# Patient Record
Sex: Female | Born: 1950 | Race: Black or African American | Hispanic: No | Marital: Single | Smoking: Former smoker
Health system: Southern US, Community
[De-identification: ages and names within clinical notes are randomized; demographics above are authoritative.]

## PROBLEM LIST (undated history)

## (undated) DIAGNOSIS — Z9889 Other specified postprocedural states: Secondary | ICD-10-CM

## (undated) DIAGNOSIS — D649 Anemia, unspecified: Secondary | ICD-10-CM

## (undated) DIAGNOSIS — T8859XA Other complications of anesthesia, initial encounter: Secondary | ICD-10-CM

## (undated) DIAGNOSIS — T4145XA Adverse effect of unspecified anesthetic, initial encounter: Secondary | ICD-10-CM

## (undated) DIAGNOSIS — R112 Nausea with vomiting, unspecified: Secondary | ICD-10-CM

## (undated) HISTORY — PX: OTHER SURGICAL HISTORY: SHX169

## (undated) HISTORY — PX: ABDOMINAL HYSTERECTOMY: SHX81

## (undated) HISTORY — PX: APPENDECTOMY: SHX54

---

## 2016-01-18 ENCOUNTER — Emergency Department (HOSPITAL_BASED_OUTPATIENT_CLINIC_OR_DEPARTMENT_OTHER): Payer: BLUE CROSS/BLUE SHIELD

## 2016-01-18 ENCOUNTER — Encounter (HOSPITAL_BASED_OUTPATIENT_CLINIC_OR_DEPARTMENT_OTHER): Payer: Self-pay | Admitting: *Deleted

## 2016-01-18 ENCOUNTER — Emergency Department (HOSPITAL_BASED_OUTPATIENT_CLINIC_OR_DEPARTMENT_OTHER)
Admission: EM | Admit: 2016-01-18 | Discharge: 2016-01-18 | Disposition: A | Discharge status: Home / Self Care | Payer: BLUE CROSS/BLUE SHIELD | Attending: Emergency Medicine | Admitting: Emergency Medicine

## 2016-01-18 DIAGNOSIS — Y990 Civilian activity done for income or pay: Secondary | ICD-10-CM | POA: Diagnosis not present

## 2016-01-18 DIAGNOSIS — W108XXA Fall (on) (from) other stairs and steps, initial encounter: Secondary | ICD-10-CM | POA: Insufficient documentation

## 2016-01-18 DIAGNOSIS — S82832A Other fracture of upper and lower end of left fibula, initial encounter for closed fracture: Secondary | ICD-10-CM | POA: Diagnosis not present

## 2016-01-18 DIAGNOSIS — Y9389 Activity, other specified: Secondary | ICD-10-CM | POA: Diagnosis not present

## 2016-01-18 DIAGNOSIS — Z87891 Personal history of nicotine dependence: Secondary | ICD-10-CM | POA: Diagnosis not present

## 2016-01-18 DIAGNOSIS — S99912A Unspecified injury of left ankle, initial encounter: Secondary | ICD-10-CM | POA: Diagnosis present

## 2016-01-18 DIAGNOSIS — Y929 Unspecified place or not applicable: Secondary | ICD-10-CM | POA: Diagnosis not present

## 2016-01-18 DIAGNOSIS — S82899A Other fracture of unspecified lower leg, initial encounter for closed fracture: Secondary | ICD-10-CM

## 2016-01-18 MED ORDER — OXYCODONE-ACETAMINOPHEN 5-325 MG PO TABS
1.0000 | ORAL_TABLET | ORAL | Status: DC | PRN
  Administered 2016-01-18: 1 via ORAL
  Filled 2016-01-18: qty 1

## 2016-01-18 MED ORDER — ETOMIDATE 2 MG/ML IV SOLN
INTRAVENOUS | Status: AC | PRN
  Administered 2016-01-18: 9.07 mg via INTRAVENOUS

## 2016-01-18 MED ORDER — ETOMIDATE 2 MG/ML IV SOLN
0.1000 mg/kg | Freq: Once | INTRAVENOUS | Status: DC
  Filled 2016-01-18: qty 10

## 2016-01-18 MED ORDER — SODIUM CHLORIDE 0.9 % IV BOLUS (SEPSIS)
500.0000 mL | Freq: Once | INTRAVENOUS | Status: AC
  Administered 2016-01-18: 500 mL via INTRAVENOUS

## 2016-01-18 MED ORDER — OXYCODONE-ACETAMINOPHEN 5-325 MG PO TABS: 1.0000 | ORAL_TABLET | ORAL | 0 refills | Status: AC | PRN

## 2016-01-18 NOTE — Discharge Instructions (Signed)
You have been seen in the ED today with an ankle fracture. We reduced the fracture and placed a splint that should stay clean and dry. Do not put weight on the ankle at all. When seated, keep the foot elevated. Call the orthopedic surgeon for an appointment in 1 week.   Return to the ED with any new or worsening pain in the ankle, fever, or chills.

## 2016-01-18 NOTE — ED Notes (Signed)
Family at bedside. 

## 2016-01-18 NOTE — ED Notes (Signed)
ETCO2 and O2 d/c'd RR 18, SpO2 100% on r/a

## 2016-01-18 NOTE — Sedation Documentation (Signed)
Unable to assess pain due to sedation.  

## 2016-01-18 NOTE — ED Provider Notes (Signed)
Emergency Department Provider Note   I have reviewed the triage vital signs and the nursing notes.   HISTORY  Chief Complaint Fall   HPI Molly Henderson is a 66 y.o. female presents to the emergency department for evaluation of left ankle pain after mechanical fall. The patient states she was coming down steps and slipped on some wet concrete. She landed on her butt and began experiencing severe left ankle pain and swelling. She was unable to ambulate but was able to call for help and presented to the emergency department. She denies any color change of the foot. No numbness or tingling in the extremity. She has no pain in her hip or knee. She denies head trauma or loss of consciousness. She is experiencing some mild right-sided lateral neck discomfort. Pain is worse with movement. She was given a Percocet prior to my evaluation states that is improved her pain symptoms.  No past medical history on file.  There are no active problems to display for this patient.   No past surgical history on file.    Allergies Penicillins  No family history on file.  Social History Social History  Substance Use Topics  . Smoking status: Never Smoker  . Smokeless tobacco: Never Used  . Alcohol use Not on file    Review of Systems  Constitutional: No fever/chills Eyes: No visual changes. ENT: No sore throat. Cardiovascular: Denies chest pain. Respiratory: Denies shortness of breath. Gastrointestinal: No abdominal pain.  No nausea, no vomiting.  No diarrhea.  No constipation. Genitourinary: Negative for dysuria. Musculoskeletal: Negative for back pain. Left ankle pain and swelling.  Skin: Negative for rash. Neurological: Negative for headaches, focal weakness or numbness.  10-point ROS otherwise negative.  ____________________________________________   PHYSICAL EXAM:  VITAL SIGNS: ED Triage Vitals  Enc Vitals Group     BP 01/18/16 1305 139/70     Pulse Rate 01/18/16 1305  78     Resp 01/18/16 1305 18     Temp 01/18/16 1305 97.8 F (36.6 C)     Temp Source 01/18/16 1305 Oral     SpO2 01/18/16 1305 100 %     Weight 01/18/16 1307 200 lb (90.7 kg)     Height 01/18/16 1307 5\' 5"  (1.651 m)     Pain Score 01/18/16 1359 9   Constitutional: Alert and oriented. Well appearing and in no acute distress. Eyes: Conjunctivae are normal.  Head: Atraumatic. Nose: No congestion/rhinnorhea. Mouth/Throat: Mucous membranes are moist.  Neck: No stridor.  Cardiovascular: Normal rate, regular rhythm. Good peripheral circulation. Grossly normal heart sounds.   Respiratory: Normal respiratory effort.  No retractions. Lungs CTAB. Gastrointestinal: Soft and nontender. No distention.  Musculoskeletal: Left ankle swelling and deformity noted. Circular abrasion to the medial malleolus. No other area of laceration or skin breakdown. The foot is neurovascularly intact.  Neurologic:  Normal speech and language. No gross focal neurologic deficits are appreciated.  Skin:  Skin is warm, dry and intact. Medial abrasion noted as above.  ____________________________________________  RADIOLOGY  Dg Ankle Complete Left  Result Date: 01/18/2016 CLINICAL DATA:  Fall with pain and injury to the left ankle EXAM: LEFT ANKLE COMPLETE - 3+ VIEW COMPARISON:  None. FINDINGS: There is a oblique fracture through the distal shaft of the fibula. 1/4 shaft diameter of lateral and posterior displacement of distal fracture fragment. Associated soft tissue swelling. Asymmetric widening of the medial mortise. Additional avulsion fracture fragments along the inferior medial joint space, uncertain if this is from  the medial malleolus or the medial talus. IMPRESSION: 1. Mildly displaced fracture through the distal fibula 2. Widened medial mortise consistent with ligamentous injury 3. Avulsion fracture injury along the medial, inferior joint margin Electronically Signed   By: Jasmine Pang M.D.   On: 01/18/2016 13:56     Dg Ankle Left Port  Result Date: 01/18/2016 CLINICAL DATA:  Post reduction film. EXAM: PORTABLE LEFT ANKLE - 2 VIEW COMPARISON:  Plain films earlier today. FINDINGS: Posterior splint across a distal fibular fracture, with less widening of the ankle mortise. Improved alignment of the distal tibia on the talus. Posterior malleolar fracture is suspected also. No definite talar fracture. IMPRESSION: Improved alignment post closed reduction. Electronically Signed   By: Elsie Stain M.D.   On: 01/18/2016 17:42    ____________________________________________   PROCEDURES  Procedure(s) performed:   .Sedation Date/Time: 01/18/2016 6:21 PM Performed by: LONG, JOSHUA G Authorized by: Maia Plan   Consent:    Consent obtained:  Written   Consent given by:  Patient   Risks discussed:  Prolonged hypoxia resulting in organ damage, prolonged sedation necessitating reversal, dysrhythmia, allergic reaction, inadequate sedation, respiratory compromise necessitating ventilatory assistance and intubation, nausea and vomiting   Alternatives discussed:  Anxiolysis Indications:    Procedure performed:  Fracture reduction   Procedure necessitating sedation performed by:  Physician performing sedation   Intended level of sedation:  Moderate (conscious sedation) Pre-sedation assessment:    Neck mobility: normal     Mallampati score:  I - soft palate, uvula, fauces, pillars visible   Pre-sedation assessments completed and reviewed: airway patency, cardiovascular function, hydration status, mental status, nausea/vomiting, pain level, respiratory function and temperature     History of difficult intubation: yes   Immediate pre-procedure details:    Reassessment: Patient reassessed immediately prior to procedure     Reviewed: vital signs, relevant labs/tests and NPO status     Verified: bag valve mask available, emergency equipment available, intubation equipment available, IV patency confirmed, oxygen  available and suction available   Procedure details (see MAR for exact dosages):    Sedation start time:  01/18/2016 4:57 PM   Preoxygenation:  Nasal cannula   Sedation:  Etomidate   Intra-procedure monitoring:  Blood pressure monitoring, cardiac monitor, continuous capnometry, frequent vital sign checks, frequent LOC assessments and continuous pulse oximetry   Intra-procedure events: none     Intra-procedure management:  Airway repositioning   Sedation end time:  01/18/2016 5:10 PM Post-procedure details:    Attendance: Constant attendance by certified staff until patient recovered     Recovery: Patient returned to pre-procedure baseline     Complications:  None   Post-sedation assessments completed and reviewed: airway patency, cardiovascular function, hydration status, mental status, nausea/vomiting, pain level, respiratory function and temperature     Patient is stable for discharge or admission: yes     Patient tolerance:  Tolerated well, no immediate complications     Fracture Reduction and Splint Application Procedure Note:  Discussed the procedure with the patient in detail. I reviewed the x-ray images prior to procedure. Conscious sedation was applied as outlined above. In-line traction was applied to the ankle and the joint was shifted medially. The patient has intact pulses and sensation after procedure. She is noted to have an abrasion to the medial ankle prior to the procedure and splint application. Bacitracin and a protective gauze was applied. Copious gauze padding along applied. A posterior mold splint with stirrups was applied for continued stabilization. Post  reduction films were reviewed and the joint mortise alignment was improved ____________________________________________   INITIAL IMPRESSION / ASSESSMENT AND PLAN / ED COURSE  Pertinent labs & imaging results that were available during my care of the patient were reviewed by me and considered in my medical decision  making (see chart for details).  Patient presents to the emergency department for evaluation of left ankle pain and swelling. She does have a small abrasion to the medial aspect of her left ankle. Does not appear to track deeper. There is an avulsion fracture on the medial side of the ankle. There is a distal, displaced fibular fracture with no abrasion or laceration on the outside of the ankle. Patient has intact pulses. Plan for orthopedic consult, splinting, crutches, and Orthopedics follow up.   05:45 PM Spoke with Dr. Linna Caprice who reviewed images. Reduction looks good. Provided crutches and return precautions. Will see ortho this week for re-evaluation.   At this time, I do not feel there is any life-threatening condition present. I have reviewed and discussed all results (EKG, imaging, lab, urine as appropriate), exam findings with patient. I have reviewed nursing notes and appropriate previous records.  I feel the patient is safe to be discharged home without further emergent workup. Discussed usual and customary return precautions. Patient and family (if present) verbalize understanding and are comfortable with this plan.  Patient will follow-up with their primary care provider. If they do not have a primary care provider, information for follow-up has been provided to them. All questions have been answered.  ____________________________________________  FINAL CLINICAL IMPRESSION(S) / ED DIAGNOSES  Final diagnoses:  Ankle fracture     MEDICATIONS GIVEN DURING THIS VISIT:  Medications  sodium chloride 0.9 % bolus 500 mL (0 mLs Intravenous Stopped 01/18/16 1807)  etomidate (AMIDATE) injection (9.07 mg Intravenous Given 01/18/16 1657)     NEW OUTPATIENT MEDICATIONS STARTED DURING THIS VISIT:  Discharge Medication List as of 01/18/2016  5:46 PM    START taking these medications   Details  oxyCODONE-acetaminophen (PERCOCET/ROXICET) 5-325 MG tablet Take 1 tablet by mouth every 4  (four) hours as needed for severe pain., Starting Fri 01/18/2016, Print        Note:  This document was prepared using Dragon voice recognition software and may include unintentional dictation errors.  Alona Bene, MD Emergency Medicine   Maia Plan, MD 01/19/16 [phone]

## 2016-01-18 NOTE — ED Triage Notes (Signed)
She was on her way to work this am and slipped on wet step. Fell down one step. Injury to her left ankle.

## 2016-01-18 NOTE — ED Notes (Signed)
Handoff report given to rec RN

## 2016-01-18 NOTE — ED Notes (Signed)
Pt states fell today, onto concrete, denies hitting head. States the ground was wet and fell due to a slippery pavement. Pain immediately to left leg. Pain all over at LLE.

## 2016-01-18 NOTE — ED Notes (Signed)
Able to move toes and left foot. Weak dorsal flexion noted, states has some numbness of left foot at this time

## 2016-01-18 NOTE — Sedation Documentation (Signed)
Patient denies pain and is resting comfortably.  

## 2016-01-18 NOTE — ED Notes (Signed)
Ice pack and elevation continued upon arrival to room

## 2016-01-18 NOTE — ED Notes (Signed)
Pt states elevation and ice helps with pain control

## 2016-01-18 NOTE — ED Notes (Signed)
Pt has 1+ left pedal pulse w/ 2+ left post tib pulse, cap refill less than 3 sec

## 2016-01-18 NOTE — ED Notes (Signed)
Patient to restroom while on the stedy.

## 2016-01-22 ENCOUNTER — Encounter
Admission: RE | Admit: 2016-01-22 | Discharge: 2016-01-22 | Disposition: A | Discharge status: Home / Self Care | Payer: BLUE CROSS/BLUE SHIELD | Source: Ambulatory Visit | Attending: Podiatry | Admitting: Podiatry

## 2016-01-22 HISTORY — DX: Anemia, unspecified: D64.9

## 2016-01-22 HISTORY — DX: Adverse effect of unspecified anesthetic, initial encounter: T41.45XA

## 2016-01-22 HISTORY — DX: Other complications of anesthesia, initial encounter: T88.59XA

## 2016-01-22 HISTORY — DX: Nausea with vomiting, unspecified: R11.2

## 2016-01-22 HISTORY — DX: Other specified postprocedural states: Z98.890

## 2016-01-22 NOTE — Patient Instructions (Signed)
  Your procedure is scheduled on: 01-25-16 Report to Same Day Surgery 2nd floor medical mall [facility](Medical Mall Entrance-take elevator on left to 2nd floor.  Check in with surgery information desk.) To find out your arrival time please call [phone] between 1PM - 3PM on 01-24-16  Remember: Instructions that are not followed completely may result in serious medical risk, up to and including death, or upon the discretion of your surgeon and anesthesiologist your surgery may need to be rescheduled.    _x___ 1. Do not eat food or drink liquids after midnight. No gum chewing or hard candies.     __x__ 2. No Alcohol for 24 hours before or after surgery.   __x__3. No Smoking for 24 prior to surgery.   ____  4. Bring all medications with you on the day of surgery if instructed.    __x__ 5. Notify your doctor if there is any change in your medical condition     (cold, fever, infections).     Do not wear jewelry, make-up, hairpins, clips or nail polish.  Do not wear lotions, powders, or perfumes. You may wear deodorant.  Do not shave 48 hours prior to surgery. Men may shave face and neck.  Do not bring valuables to the hospital.    [facility] is not responsible for any belongings or valuables.               Contacts, dentures or bridgework may not be worn into surgery.  Leave your suitcase in the car. After surgery it may be brought to your room.  For patients admitted to the hospital, discharge time is determined by your treatment team.   Patients discharged the day of surgery will not be allowed to drive home.  You will need someone to drive you home and stay with you the night of your procedure.    Please read over the following fact sheets that you were given:   [facility] Preparing for Surgery and or MRSA Information   ____ Take these medicines the morning of surgery with A SIP OF WATER:    1. MAY TAKE PERCOCET IF NEEDED AM OF SURGERY  2.  3.  4.  5.  6.  ____Fleets enema or  Magnesium Citrate as directed.   ____ Use CHG Soap or sage wipes as directed on instruction sheet   ____ Use inhalers on the day of surgery and bring to hospital day of surgery  ____ Stop metformin 2 days prior to surgery    ____ Take 1/2 of usual insulin dose the night before surgery and none on the morning of  surgery.   ____ Stop Aspirin, Coumadin, Pllavix ,Eliquis, Effient, or Pradaxa  x__ Stop Anti-inflammatories such as Advil, Aleve, Ibuprofen, Motrin, Naproxen,          Naprosyn, Goodies powders or aspirin products NOW-Ok to take Tylenol OR PERCOCET IF NEEDED    _X___ Stop supplements until after surgery-PT STOPPED ALL SUPPLEMENTS DAYS AGO  ____ Bring C-Pap to the hospital.

## 2016-01-25 ENCOUNTER — Ambulatory Visit: Payer: BLUE CROSS/BLUE SHIELD | Admitting: Anesthesiology

## 2016-01-25 ENCOUNTER — Other Ambulatory Visit: Payer: Self-pay

## 2016-01-25 ENCOUNTER — Encounter
Admission: RE | Disposition: A | Discharge status: Home / Self Care | Payer: Self-pay | Source: Ambulatory Visit | Attending: Podiatry

## 2016-01-25 ENCOUNTER — Ambulatory Visit: Payer: BLUE CROSS/BLUE SHIELD

## 2016-01-25 ENCOUNTER — Encounter: Payer: Self-pay | Admitting: *Deleted

## 2016-01-25 ENCOUNTER — Ambulatory Visit
Admission: RE | Admit: 2016-01-25 | Discharge: 2016-01-25 | Disposition: A | Discharge status: Home / Self Care | Payer: BLUE CROSS/BLUE SHIELD | Source: Ambulatory Visit | Attending: Podiatry | Admitting: Podiatry

## 2016-01-25 DIAGNOSIS — S82832A Other fracture of upper and lower end of left fibula, initial encounter for closed fracture: Secondary | ICD-10-CM | POA: Diagnosis present

## 2016-01-25 DIAGNOSIS — S93422A Sprain of deltoid ligament of left ankle, initial encounter: Secondary | ICD-10-CM | POA: Insufficient documentation

## 2016-01-25 DIAGNOSIS — X501XXA Overexertion from prolonged static or awkward postures, initial encounter: Secondary | ICD-10-CM | POA: Diagnosis not present

## 2016-01-25 DIAGNOSIS — Z419 Encounter for procedure for purposes other than remedying health state, unspecified: Secondary | ICD-10-CM

## 2016-01-25 DIAGNOSIS — Z01818 Encounter for other preprocedural examination: Secondary | ICD-10-CM

## 2016-01-25 DIAGNOSIS — Z87891 Personal history of nicotine dependence: Secondary | ICD-10-CM | POA: Insufficient documentation

## 2016-01-25 HISTORY — PX: ORIF ANKLE FRACTURE: SHX5408

## 2016-01-25 SURGERY — OPEN REDUCTION INTERNAL FIXATION (ORIF) ANKLE FRACTURE
Anesthesia: General | Laterality: Left

## 2016-01-25 MED ORDER — OXYCODONE-ACETAMINOPHEN 5-325 MG PO TABS: 1.0000 | ORAL_TABLET | ORAL | Status: DC | PRN

## 2016-01-25 MED ORDER — SUGAMMADEX SODIUM 200 MG/2ML IV SOLN
INTRAVENOUS | Status: DC | PRN
  Administered 2016-01-25: 180 mg via INTRAVENOUS

## 2016-01-25 MED ORDER — PROPOFOL 10 MG/ML IV BOLUS
INTRAVENOUS | Status: DC | PRN
  Administered 2016-01-25: 200 mg via INTRAVENOUS

## 2016-01-25 MED ORDER — ACETAMINOPHEN 10 MG/ML IV SOLN
INTRAVENOUS | Status: DC | PRN
  Administered 2016-01-25: 1000 mg via INTRAVENOUS

## 2016-01-25 MED ORDER — MIDAZOLAM HCL 2 MG/2ML IJ SOLN
INTRAMUSCULAR | Status: AC
  Filled 2016-01-25: qty 2

## 2016-01-25 MED ORDER — MIDAZOLAM HCL 2 MG/2ML IJ SOLN
INTRAMUSCULAR | Status: DC | PRN
  Administered 2016-01-25: 2 mg via INTRAVENOUS

## 2016-01-25 MED ORDER — DEXAMETHASONE SODIUM PHOSPHATE 10 MG/ML IJ SOLN
INTRAMUSCULAR | Status: AC
  Filled 2016-01-25: qty 1

## 2016-01-25 MED ORDER — FENTANYL CITRATE (PF) 100 MCG/2ML IJ SOLN
INTRAMUSCULAR | Status: DC | PRN
  Administered 2016-01-25 (×4): 50 ug via INTRAVENOUS

## 2016-01-25 MED ORDER — FENTANYL CITRATE (PF) 100 MCG/2ML IJ SOLN
INTRAMUSCULAR | Status: AC
  Filled 2016-01-25: qty 2

## 2016-01-25 MED ORDER — MIDAZOLAM HCL 2 MG/2ML IJ SOLN
2.0000 mg | Freq: Once | INTRAMUSCULAR | Status: AC
  Administered 2016-01-25: 2 mg via INTRAVENOUS

## 2016-01-25 MED ORDER — ONDANSETRON HCL 4 MG/2ML IJ SOLN: 4.0000 mg | Freq: Once | INTRAMUSCULAR | Status: DC | PRN

## 2016-01-25 MED ORDER — CLINDAMYCIN PHOSPHATE 600 MG/50ML IV SOLN
600.0000 mg | Freq: Once | INTRAVENOUS | Status: AC
  Administered 2016-01-25: 600 mg via INTRAVENOUS

## 2016-01-25 MED ORDER — ONDANSETRON HCL 4 MG/2ML IJ SOLN
INTRAMUSCULAR | Status: DC | PRN
  Administered 2016-01-25: 4 mg via INTRAVENOUS

## 2016-01-25 MED ORDER — FENTANYL CITRATE (PF) 100 MCG/2ML IJ SOLN
INTRAMUSCULAR | Status: AC
  Administered 2016-01-25: 25 ug via INTRAVENOUS
  Filled 2016-01-25: qty 2

## 2016-01-25 MED ORDER — ONDANSETRON HCL 4 MG/2ML IJ SOLN
INTRAMUSCULAR | Status: AC
Start: 2016-01-25 — End: 2016-01-25
  Filled 2016-01-25: qty 2

## 2016-01-25 MED ORDER — ROCURONIUM BROMIDE 50 MG/5ML IV SOSY
PREFILLED_SYRINGE | INTRAVENOUS | Status: AC
  Filled 2016-01-25: qty 5

## 2016-01-25 MED ORDER — DEXAMETHASONE SODIUM PHOSPHATE 10 MG/ML IJ SOLN
INTRAMUSCULAR | Status: DC | PRN
  Administered 2016-01-25: 10 mg via INTRAVENOUS

## 2016-01-25 MED ORDER — FENTANYL CITRATE (PF) 100 MCG/2ML IJ SOLN
INTRAMUSCULAR | Status: AC
  Administered 2016-01-25: 50 ug via INTRAVENOUS
  Filled 2016-01-25: qty 2

## 2016-01-25 MED ORDER — PROPOFOL 10 MG/ML IV BOLUS
INTRAVENOUS | Status: AC
  Filled 2016-01-25: qty 20

## 2016-01-25 MED ORDER — FENTANYL CITRATE (PF) 100 MCG/2ML IJ SOLN
50.0000 ug | Freq: Once | INTRAMUSCULAR | Status: AC
  Administered 2016-01-25: 50 ug via INTRAVENOUS

## 2016-01-25 MED ORDER — ONDANSETRON HCL 4 MG/2ML IJ SOLN: 4.0000 mg | Freq: Four times a day (QID) | INTRAMUSCULAR | Status: DC | PRN

## 2016-01-25 MED ORDER — BUPIVACAINE HCL (PF) 0.5 % IJ SOLN
INTRAMUSCULAR | Status: AC
  Filled 2016-01-25: qty 30

## 2016-01-25 MED ORDER — FENTANYL CITRATE (PF) 100 MCG/2ML IJ SOLN
25.0000 ug | INTRAMUSCULAR | Status: DC | PRN
  Administered 2016-01-25 (×4): 25 ug via INTRAVENOUS

## 2016-01-25 MED ORDER — BUPIVACAINE HCL (PF) 0.25 % IJ SOLN
INTRAMUSCULAR | Status: DC | PRN
  Administered 2016-01-25: 20 mL

## 2016-01-25 MED ORDER — BUPIVACAINE HCL (PF) 0.25 % IJ SOLN
INTRAMUSCULAR | Status: AC
  Filled 2016-01-25: qty 30

## 2016-01-25 MED ORDER — ONDANSETRON HCL 4 MG PO TABS: 4.0000 mg | ORAL_TABLET | Freq: Four times a day (QID) | ORAL | Status: DC | PRN

## 2016-01-25 MED ORDER — ROCURONIUM BROMIDE 100 MG/10ML IV SOLN
INTRAVENOUS | Status: DC | PRN
  Administered 2016-01-25: 30 mg via INTRAVENOUS

## 2016-01-25 MED ORDER — LIDOCAINE HCL (PF) 1 % IJ SOLN
INTRAMUSCULAR | Status: AC
  Filled 2016-01-25: qty 30

## 2016-01-25 MED ORDER — LIDOCAINE HCL (CARDIAC) 20 MG/ML IV SOLN
INTRAVENOUS | Status: DC | PRN
  Administered 2016-01-25: 100 mg via INTRAVENOUS

## 2016-01-25 MED ORDER — MIDAZOLAM HCL 2 MG/2ML IJ SOLN
INTRAMUSCULAR | Status: AC
  Administered 2016-01-25: 2 mg via INTRAVENOUS
  Filled 2016-01-25: qty 2

## 2016-01-25 MED ORDER — EPHEDRINE 5 MG/ML INJ
INTRAVENOUS | Status: AC
Start: 2016-01-25 — End: 2016-01-25
  Filled 2016-01-25: qty 10

## 2016-01-25 MED ORDER — LACTATED RINGERS IV SOLN
INTRAVENOUS | Status: DC
  Administered 2016-01-25: 13:00:00 via INTRAVENOUS

## 2016-01-25 MED ORDER — FAMOTIDINE 20 MG PO TABS
20.0000 mg | ORAL_TABLET | Freq: Once | ORAL | Status: AC
  Administered 2016-01-25: 20 mg via ORAL

## 2016-01-25 MED ORDER — FAMOTIDINE 20 MG PO TABS
ORAL_TABLET | ORAL | Status: AC
  Administered 2016-01-25: 20 mg via ORAL
  Filled 2016-01-25: qty 1

## 2016-01-25 MED ORDER — SUGAMMADEX SODIUM 200 MG/2ML IV SOLN
INTRAVENOUS | Status: AC
  Filled 2016-01-25: qty 2

## 2016-01-25 MED ORDER — ROPIVACAINE HCL 5 MG/ML IJ SOLN
INTRAMUSCULAR | Status: DC | PRN
  Administered 2016-01-25: 30 mL via EPIDURAL

## 2016-01-25 MED ORDER — EPHEDRINE SULFATE 50 MG/ML IJ SOLN
INTRAMUSCULAR | Status: DC | PRN
  Administered 2016-01-25 (×2): 10 mg via INTRAVENOUS

## 2016-01-25 MED ORDER — CLINDAMYCIN PHOSPHATE 600 MG/50ML IV SOLN
INTRAVENOUS | Status: AC
  Filled 2016-01-25: qty 50

## 2016-01-25 MED ORDER — LIDOCAINE HCL (PF) 2 % IJ SOLN
INTRAMUSCULAR | Status: AC
  Filled 2016-01-25: qty 2

## 2016-01-25 MED ORDER — ACETAMINOPHEN 10 MG/ML IV SOLN
INTRAVENOUS | Status: AC
  Filled 2016-01-25: qty 100

## 2016-01-25 SURGICAL SUPPLY — 51 items
BANDAGE ELASTIC 4 LF NS (GAUZE/BANDAGES/DRESSINGS) ×2 IMPLANT
BANDAGE STRETCH 3X4.1 STRL (GAUZE/BANDAGES/DRESSINGS) ×2 IMPLANT
BIT DRILL 2.5X2.75 QC CALB (BIT) ×2 IMPLANT
BIT DRILL CALIBRATED 2.7 (BIT) ×2 IMPLANT
BLADE SURG 15 STRL LF DISP TIS (BLADE) IMPLANT
BLADE SURG 15 STRL SS (BLADE)
BNDG COHESIVE 4X5 TAN STRL (GAUZE/BANDAGES/DRESSINGS) ×2 IMPLANT
BNDG ESMARK 4X12 TAN STRL LF (GAUZE/BANDAGES/DRESSINGS) ×2 IMPLANT
BNDG GAUZE 4.5X4.1 6PLY STRL (MISCELLANEOUS) ×2 IMPLANT
CANISTER SUCT 1200ML W/VALVE (MISCELLANEOUS) ×2 IMPLANT
CUFF TOURN 30 N/S (MISCELLANEOUS) ×2 IMPLANT
DRAPE C-ARM XRAY 36X54 (DRAPES) ×2 IMPLANT
DRAPE C-ARMOR (DRAPES) ×2 IMPLANT
DURAPREP 26ML APPLICATOR (WOUND CARE) ×2 IMPLANT
ELECT REM PT RETURN 9FT ADLT (ELECTROSURGICAL) ×2
ELECTRODE REM PT RTRN 9FT ADLT (ELECTROSURGICAL) ×1 IMPLANT
GAUZE PETRO XEROFOAM 1X8 (MISCELLANEOUS) ×2 IMPLANT
GAUZE SPONGE 4X4 12PLY STRL (GAUZE/BANDAGES/DRESSINGS) ×2 IMPLANT
GAUZE STRETCH 2X75IN STRL (MISCELLANEOUS) ×2 IMPLANT
GLOVE BIO SURGEON STRL SZ7.5 (GLOVE) ×2 IMPLANT
GLOVE INDICATOR 8.0 STRL GRN (GLOVE) ×2 IMPLANT
GOWN STRL REUS W/ TWL LRG LVL3 (GOWN DISPOSABLE) ×3 IMPLANT
GOWN STRL REUS W/TWL LRG LVL3 (GOWN DISPOSABLE) ×3
KIT RM TURNOVER STRD PROC AR (KITS) ×2 IMPLANT
LABEL OR SOLS (LABEL) ×2 IMPLANT
NS IRRIG 500ML POUR BTL (IV SOLUTION) ×2 IMPLANT
PACK EXTREMITY ARMC (MISCELLANEOUS) ×2 IMPLANT
PAD PREP 24X41 OB/GYN DISP (PERSONAL CARE ITEMS) ×2 IMPLANT
PLATE LOCK 7H 92 BILAT FIB (Plate) ×2 IMPLANT
SCREW LOCK 3.5X12 DIST TIB (Screw) ×2 IMPLANT
SCREW LOCK CORT STAR 3.5X10 (Screw) ×2 IMPLANT
SCREW LOCK CORT STAR 3.5X12 (Screw) ×4 IMPLANT
SCREW LOCK CORT STAR 3.5X14 (Screw) ×2 IMPLANT
SCREW LOCK CORT STAR 3.5X16 (Screw) ×2 IMPLANT
SCREW LOW PROFILE 22MMX3.5MM (Screw) ×2 IMPLANT
SCREW NON LOCKING LP 3.5 16MM (Screw) ×2 IMPLANT
SPLINT CAST 1 STEP 4X30 (MISCELLANEOUS) ×2 IMPLANT
SPLINT FAST PLASTER 5X30 (CAST SUPPLIES) ×1
SPLINT PLASTER CAST FAST 5X30 (CAST SUPPLIES) ×1 IMPLANT
SPONGE LAP 18X18 5 PK (GAUZE/BANDAGES/DRESSINGS) ×2 IMPLANT
STAPLER SKIN PROX 35W (STAPLE) ×2 IMPLANT
STOCKINETTE M/LG 89821 (MISCELLANEOUS) ×2 IMPLANT
STRAP SAFETY BODY (MISCELLANEOUS) ×2 IMPLANT
STRIP CLOSURE SKIN 1/2X4 (GAUZE/BANDAGES/DRESSINGS) IMPLANT
SUT PDS AB 0 CT1 27 (SUTURE) ×2 IMPLANT
SUT VIC AB 2-0 CT1 27 (SUTURE) ×1
SUT VIC AB 2-0 CT1 TAPERPNT 27 (SUTURE) ×1 IMPLANT
SUT VIC AB 3-0 SH 27 (SUTURE) ×2
SUT VIC AB 3-0 SH 27X BRD (SUTURE) ×2 IMPLANT
SWABSTK COMLB BENZOIN TINCTURE (MISCELLANEOUS) IMPLANT
SYRINGE 10CC LL (SYRINGE) ×2 IMPLANT

## 2016-01-25 NOTE — Anesthesia Preprocedure Evaluation (Addendum)
Anesthesia Evaluation  Patient identified by MRN, date of birth, ID band Patient awake    Reviewed: Allergy & Precautions, NPO status , Patient's Chart, lab work & pertinent test results  History of Anesthesia Complications (+) PONV  Airway Mallampati: II  TM Distance: <3 FB     Dental  (+) Chipped   Pulmonary former smoker,    Pulmonary exam normal        Cardiovascular negative cardio ROS Normal cardiovascular exam     Neuro/Psych negative neurological ROS  negative psych ROS   GI/Hepatic negative GI ROS, Neg liver ROS,   Endo/Other  negative endocrine ROS  Renal/GU negative Renal ROS  negative genitourinary   Musculoskeletal negative musculoskeletal ROS (+)   Abdominal Normal abdominal exam  (+)   Peds negative pediatric ROS (+)  Hematology  (+) anemia ,   Anesthesia Other Findings   Reproductive/Obstetrics                            Anesthesia Physical Anesthesia Plan  ASA: II  Anesthesia Plan: General   Post-op Pain Management:  Regional for Post-op pain   Induction: Intravenous  Airway Management Planned: LMA and Oral ETT  Additional Equipment:   Intra-op Plan:   Post-operative Plan: Extubation in OR  Informed Consent: I have reviewed the patients History and Physical, chart, labs and discussed the procedure including the risks, benefits and alternatives for the proposed anesthesia with the patient or authorized representative who has indicated his/her understanding and acceptance.   Dental advisory given  Plan Discussed with: CRNA and Surgeon  Anesthesia Plan Comments: (Risks and benefits discussed with patient regarding a left popliteal block done under US for post-op pain control.  Patient understands and wishes to proceed with block as per surgeon request)       Anesthesia Quick Evaluation

## 2016-01-25 NOTE — Op Note (Signed)
Operative note   Surgeon:Kline Bulthuis [employer] forces logistics/support/administrative officerowler    Assistant: None    Preop diagnosis: 1. Distal fibular fracture left ankle 2. Deltoid ligament tear    Postop diagnosis: 1. Distal fibular fracture left ankle 2. Left ankle done toward ligament tear    Procedure: 1. Open reduction with internal fixation distal fibular fracture 2. Open repair medial deltoid ligament left ankle    EBL: 10 ML's    Anesthesia:regional and general    Hemostasis: Midcalf tourniquet inflated to 250 mmHg for approximately 80 minutes    Specimen: None    Complications: None    Operative indications:Timia Furno is an 66 y.o. that presents today for surgical intervention.  The risks/benefits/alternatives/complications have been discussed and consent has been given.    Procedure:  Patient was brought into the OR and placed on the operating table in thesupine position. After anesthesia was obtained theleft lower extremity was prepped and draped in usual sterile fashion.  Attention was directed to the lateral aspect of the ankle where a longitudinal incision was performed. Sharp and blunt dissection carried down to the periosteum. Subperiosteal dissection was undertaken and exposed the distal fibular shortening oblique fracture. This was then reduced with a bone clamp. A 3.5 mm lag screw was placed from anterior to posterior for stabilization. Good stabilization was noted. At this time a 7 hole plate from [employer] was placed on the lateral aspect of the fibula. Screws proximal and distal were filled with 3.5 mm locking screws and a single nonlocking screw on a screw hole. Good alignment and stability was noted. The fracture was held anatomic with good stability. As time attention was directed to the medial malleolus where a curved incision was made along the malleolus. Sharp and blunt dissection was carried down to the deeper fascial layer. At this time the deltoid ligament rupture was noted and had and flipped into the medial  gutter of the ankle joint. This was removed. The wound was flushed with copious amounts or irrigation. Layered ligament was then repaired anatomically with a 0 PDS suture. Good stability of the ligament was noted and it was in a anatomic position at this time. The wound was flushed with copious amounts or irrigation. Final x-ray revealed an anatomic ankle joint position with good stability. Layered closure was performed with 20 and 3-0 Vicryl for the deeper and subcutaneous tissues and skin staples for skin. 0.25% Marcaine was infiltrated along around all incision sites. A well compressive sterile bulky dressing was then applied. She was then placed in a posterior splint.    Patient tolerated the procedure and anesthesia well.  Was transported from the OR to the PACU with all vital signs stable and vascular status intact. To be discharged per routine protocol.  Will follow up in approximately 1 week in the outpatient clinic.

## 2016-01-25 NOTE — Anesthesia Post-op Follow-up Note (Cosign Needed)
Anesthesia QCDR form completed.        

## 2016-01-25 NOTE — Anesthesia Procedure Notes (Signed)
Procedure Name: Intubation Date/Time: 01/25/2016 1:35 PM Performed by: Darlyne Russian Pre-anesthesia Checklist: Patient identified, Emergency Drugs available, Suction available, Patient being monitored and Timeout performed Patient Re-evaluated:Patient Re-evaluated prior to inductionOxygen Delivery Method: Circle system utilized Preoxygenation: Pre-oxygenation with 100% oxygen Intubation Type: IV induction Ventilation: Mask ventilation without difficulty Laryngoscope Size: Mac and 3 Grade View: Grade II Tube type: Oral Tube size: 7.0 mm Number of attempts: 1 Airway Equipment and Method: Stylet Placement Confirmation: ETT inserted through vocal cords under direct vision,  positive ETCO2 and breath sounds checked- equal and bilateral Secured at: 22 cm Tube secured with: Tape Dental Injury: Teeth and Oropharynx as per pre-operative assessment

## 2016-01-25 NOTE — Anesthesia Procedure Notes (Addendum)
Anesthesia Regional Block:  Popliteal block  Pre-Anesthetic Checklist: ,, timeout performed, Correct Patient, Correct Site, Correct Laterality, Correct Procedure, Correct Position, site marked, Risks and benefits discussed,  Surgical consent,  Pre-op evaluation,  At surgeon's request and post-op pain management  Laterality: Left  Prep: Maximum Sterile Barrier Precautions used, chloraprep, alcohol swabs       Needles:  Injection technique: Single-shot  Needle Type: Stimiplex     Needle Length: 9cm 9 cm Needle Gauge: 20 and 20 G    Additional Needles:  Procedures: ultrasound guided (picture in chart) and nerve stimulator Popliteal block  Nerve Stimulator or Paresthesia:  Response: 0.5 mA,   Additional Responses:   Narrative:  Start time: 01/25/2016 12:40 PM End time: 01/25/2016 12:50 PM Injection made incrementally with aspirations every 5 mL. Anesthesiologist: Yves DillARROLL, Emily Forse  Additional Notes: Time out called.  Left lower extremity positioned with slight flexion of knee with help of blankets.  The lateral aspect of the left lower extremity and popliteal fossa was prepped with chloroprep.  The US probe was prepped and sterile lube was applied.   The TN and the PN were visualized just as they were merging.  A skin wheal was made lateral on the thigh at that point with 1% Lidocaine plain.  A 20 G stimuplex needle was guided to the lateral aspect just below the bundle with a twitch being elicited with fade at 0.5 mAmps.  Incremental injection of 30 cc of 0.5% Ropivacaine was injected with ease.  No pain on injection and good spread around the nerve.  Patient tolerated the procedure well .  No blood or paresthesias..Marland Kitchen

## 2016-01-25 NOTE — H&P (Signed)
HISTORY AND PHYSICAL INTERVAL NOTE:  01/25/2016  1:02 PM  Molly Henderson  has presented today for surgery, with the diagnosis of LEFT ANKLE BIMALLEOLAR  FRACTURE .  The various methods of treatment have been discussed with the patient.  No guarantees were given.  After consideration of risks, benefits and other options for treatment, the patient has consented to surgery.  I have reviewed the patients' chart and labs.    Patient Vitals for the past 24 hrs:  BP Temp Temp src Pulse Resp SpO2 Height Weight  01/25/16 1251 118/68 - - 74 15 100 % - -  01/25/16 1246 122/65 - - 69 10 99 % - -  01/25/16 1241 107/67 - - 68 16 99 % - -  01/25/16 1236 108/61 - - 70 17 100 % - -  01/25/16 1231 115/62 - - 70 13 99 % - -  01/25/16 1226 128/73 - - 73 13 100 % - -  01/25/16 1221 (!) 124/50 - - 73 13 100 % - -  01/25/16 1151 112/65 - - 87 - 96 % - -  01/25/16 1148 - 97.3 F (36.3 C) Tympanic - 18 - 5\' 5"  (1.651 m) 90.7 kg (200 lb)    A history and physical examination was performed in my office.  The patient was reexamined.  There have been no changes to this history and physical examination.  Gwyneth RevelsFowler, Arsen Mangione A

## 2016-01-25 NOTE — Discharge Instructions (Signed)
[facility]  POST OPERATIVE INSTRUCTIONS FOR DR. TROXLER AND DR. Genevieve [facility]   1. Take your medication as prescribed.  Pain medication should be taken only as needed.  2. Keep the dressing clean, dry and intact.  3. Keep your foot elevated above the heart level for the first 48 hours.  4. Walking to the bathroom and brief periods of walking are acceptable, unless we have instructed you to be non-weight bearing.  5. Always wear your post-op shoe when walking.  Always use your crutches if you are to be non-weight bearing.  6. Do not take a shower. Baths are permissible as long as the foot is kept out of the water.   7. Every hour you are awake:  - Bend your knee 15 times. - Massage calf 15 times  8. [facility] [phone]) if any of the following problems occur: - You develop a temperature or fever. - The bandage becomes saturated with blood. - Medication does not stop your pain. - Injury of the foot occurs. - Any symptoms of infection including redness, odor, or red streaks running from wound.   AMBULATORY SURGERY  DISCHARGE INSTRUCTIONS   1) The drugs that you were given will stay in your system until tomorrow so for the next 24 hours you should not:  A) Drive an automobile B) Make any legal decisions C) Drink any alcoholic beverage   2) You may resume regular meals tomorrow.  Today it is better to start with liquids and gradually work up to solid foods.  You may eat anything you prefer, but it is better to start with liquids, then soup and crackers, and gradually work up to solid foods.   3) Please notify your doctor immediately if you have any unusual bleeding, trouble breathing, redness and pain at the surgery site, drainage, fever, or pain not relieved by medication.   4) Additional Instructions:

## 2016-01-25 NOTE — Transfer of Care (Signed)
Immediate Anesthesia Transfer of Care Note  Patient: Media plannerClaudia Henderson  Procedure(s) Performed: Procedure(s): OPEN REDUCTION INTERNAL FIXATION (ORIF) ANKLE FRACTURE (Left)  Patient Location: PACU  Anesthesia Type:General  Level of Consciousness: patient cooperative and responds to stimulation  Airway & Oxygen Therapy: Patient Spontanous Breathing and Patient connected to nasal cannula oxygen  Post-op Assessment: Report given to RN and Post -op Vital signs reviewed and stable  Post vital signs: Reviewed and stable  Last Vitals:  Vitals:   01/25/16 1520 01/25/16 1521  BP:  (!) 144/71  Pulse:  86  Resp:  12  Temp: 36.2 C     Last Pain:  Vitals:   01/25/16 1521  TempSrc:   PainSc: 0-No pain         Complications: No apparent anesthesia complications

## 2016-01-28 ENCOUNTER — Encounter: Payer: Self-pay | Admitting: Podiatry

## 2016-02-01 NOTE — Anesthesia Postprocedure Evaluation (Signed)
Anesthesia Post Note  Patient: Media plannerClaudia Henderson  Procedure(s) Performed: Procedure(s) (LRB): OPEN REDUCTION INTERNAL FIXATION (ORIF) ANKLE FRACTURE (Left)  Patient location during evaluation: PACU Anesthesia Type: General Level of consciousness: awake and alert and oriented Pain management: pain level controlled Vital Signs Assessment: post-procedure vital signs reviewed and stable Respiratory status: spontaneous breathing Cardiovascular status: blood pressure returned to baseline Anesthetic complications: no Comments: Patient tolerated the procedure well.  No pain after block     Last Vitals:  Vitals:   01/25/16 1630 01/25/16 1717  BP: 132/62 (!) 142/62  Pulse: 69 76  Resp: 18 16  Temp: 36.2 C     Last Pain:  Vitals:   01/29/16 1024  TempSrc:   PainSc: 3                  Arjen Deringer

## 2019-01-10 IMAGING — DX DG ANKLE PORT 2V*L*
2 series · 2 of 2 positions shown · non-contrast
Comparison: Plain films earlier today.

CLINICAL DATA: Post reduction film.

EXAM:
PORTABLE LEFT ANKLE - 2 VIEW

[ankle ap]
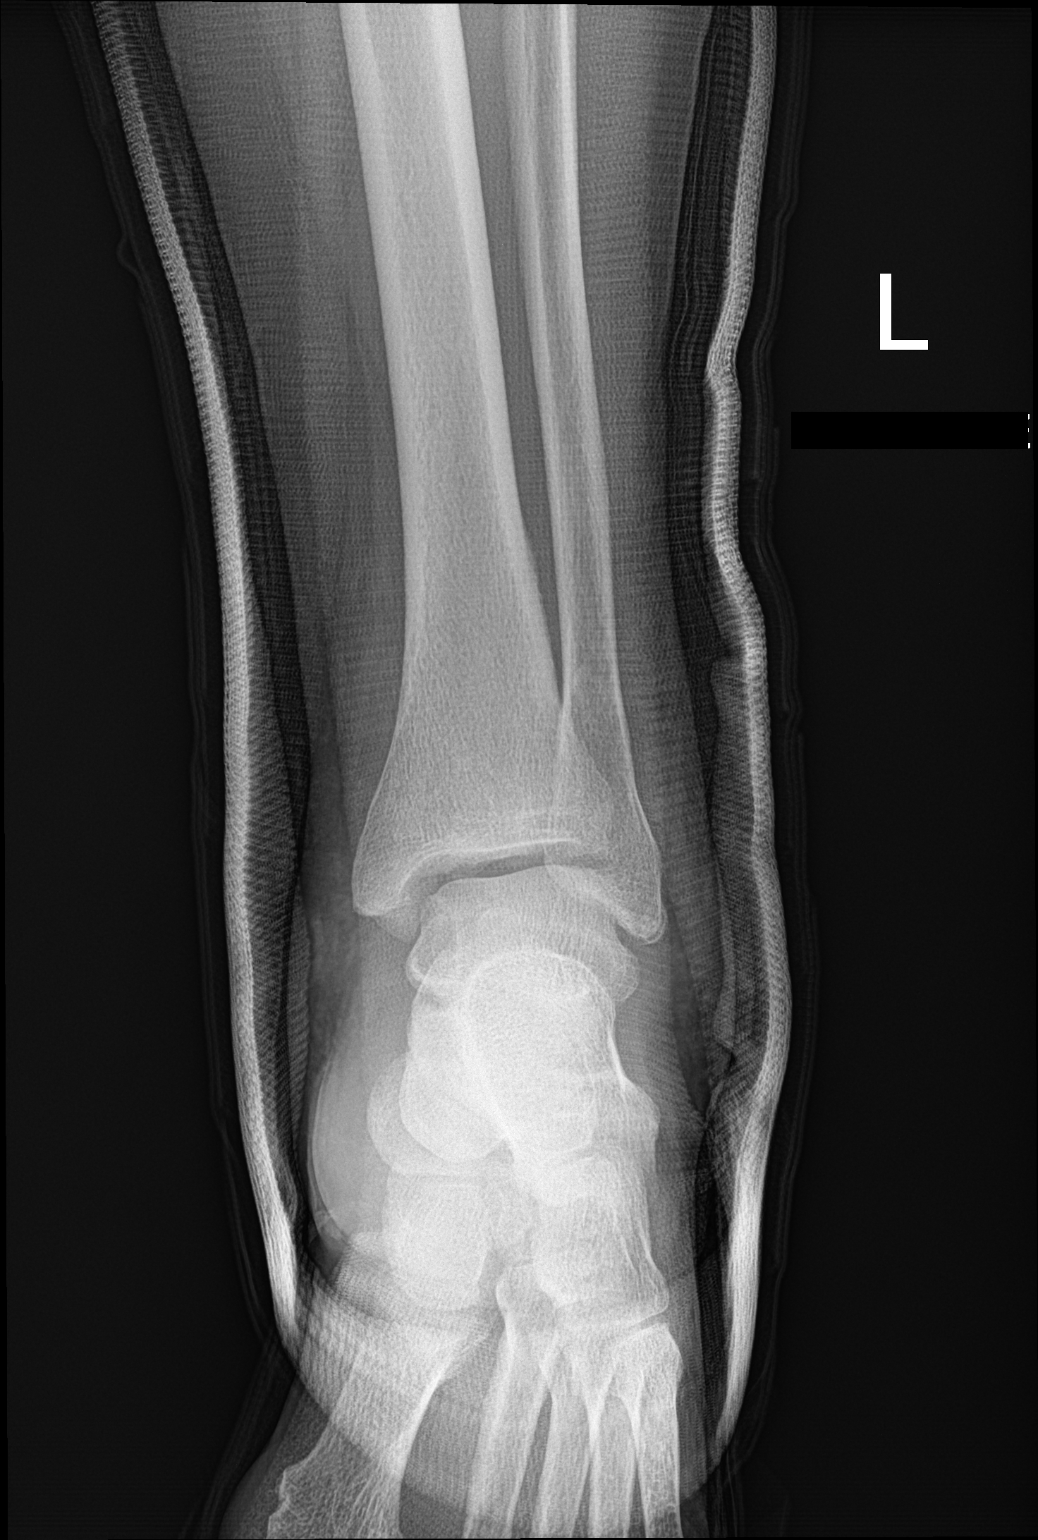

[ankle lat]
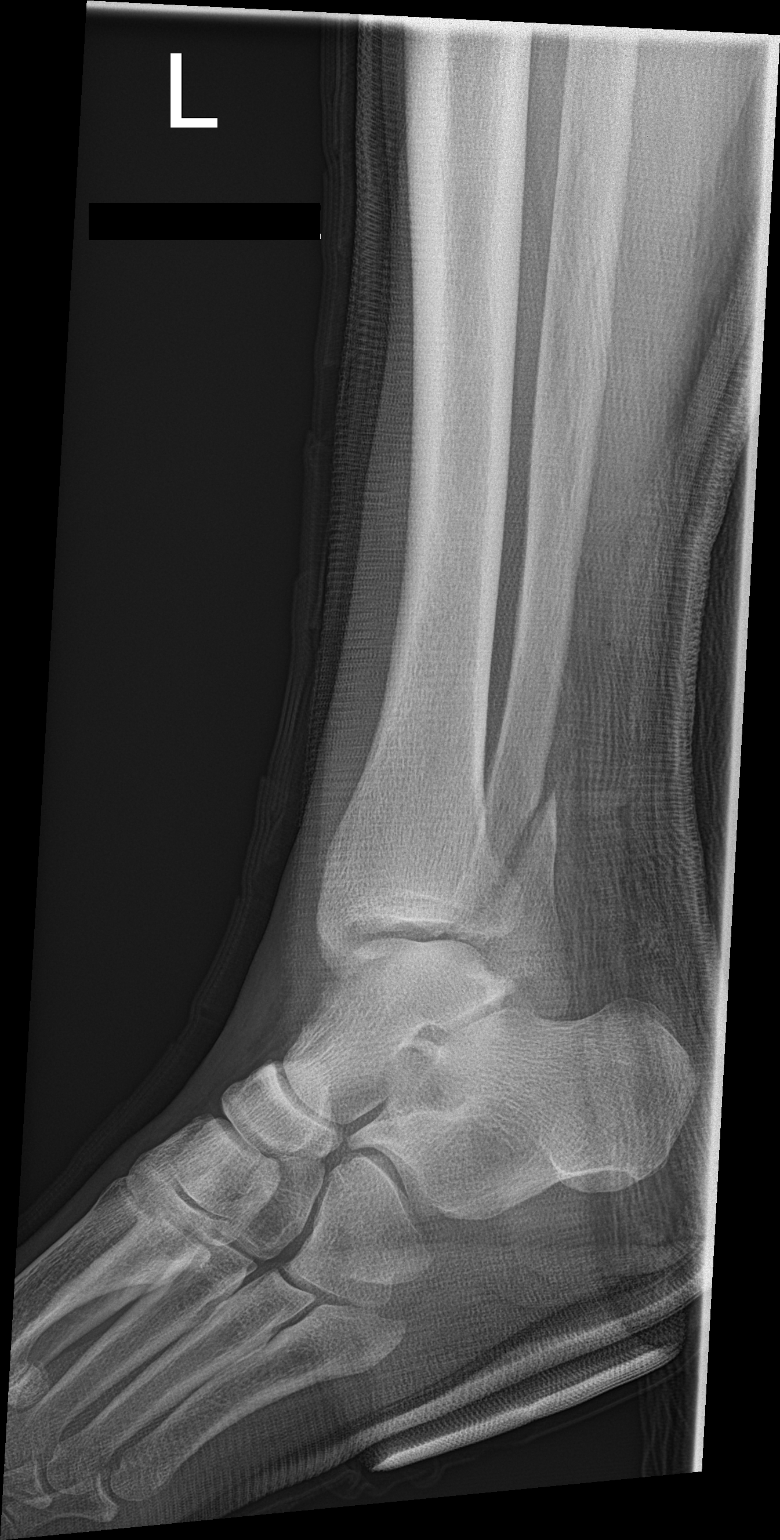

[2 of 2 positions shown; findings below may reference images not displayed]

FINDINGS: Posterior splint across a distal fibular fracture, with less
widening of the ankle mortise. Improved alignment of the distal
tibia on the talus. Posterior malleolar fracture is suspected also.
No definite talar fracture.
IMPRESSION: Improved alignment post closed reduction.
